# Patient Record
Sex: Female | Born: 1980 | Race: White | Hispanic: No | Marital: Single | Smoking: Former smoker
Health system: Southern US, Community
[De-identification: ages and names within clinical notes are randomized; demographics above are authoritative.]

## PROBLEM LIST (undated history)

## (undated) DIAGNOSIS — E282 Polycystic ovarian syndrome: Secondary | ICD-10-CM

## (undated) DIAGNOSIS — F32A Depression, unspecified: Secondary | ICD-10-CM

## (undated) HISTORY — DX: Depression, unspecified: F32.A

## (undated) HISTORY — PX: BACK SURGERY: SHX140

---

## 2013-03-06 HISTORY — PX: LAMINECTOMY: SHX219

## 2013-08-16 ENCOUNTER — Encounter (HOSPITAL_COMMUNITY): Payer: Self-pay | Admitting: Emergency Medicine

## 2013-08-16 ENCOUNTER — Emergency Department (HOSPITAL_COMMUNITY): Payer: BC Managed Care – PPO

## 2013-08-16 ENCOUNTER — Emergency Department (HOSPITAL_COMMUNITY)
Admission: EM | Admit: 2013-08-16 | Discharge: 2013-08-16 | Disposition: A | Discharge status: Home / Self Care | Payer: BC Managed Care – PPO | Attending: Emergency Medicine | Admitting: Emergency Medicine

## 2013-08-16 DIAGNOSIS — R109 Unspecified abdominal pain: Secondary | ICD-10-CM | POA: Diagnosis not present

## 2013-08-16 DIAGNOSIS — O9989 Other specified diseases and conditions complicating pregnancy, childbirth and the puerperium: Secondary | ICD-10-CM | POA: Diagnosis not present

## 2013-08-16 DIAGNOSIS — Z87891 Personal history of nicotine dependence: Secondary | ICD-10-CM | POA: Diagnosis not present

## 2013-08-16 DIAGNOSIS — O4690 Antepartum hemorrhage, unspecified, unspecified trimester: Secondary | ICD-10-CM

## 2013-08-16 DIAGNOSIS — Z880 Allergy status to penicillin: Secondary | ICD-10-CM | POA: Diagnosis not present

## 2013-08-16 DIAGNOSIS — O209 Hemorrhage in early pregnancy, unspecified: Secondary | ICD-10-CM | POA: Insufficient documentation

## 2013-08-16 DIAGNOSIS — Z862 Personal history of diseases of the blood and blood-forming organs and certain disorders involving the immune mechanism: Secondary | ICD-10-CM | POA: Diagnosis not present

## 2013-08-16 DIAGNOSIS — Z8639 Personal history of other endocrine, nutritional and metabolic disease: Secondary | ICD-10-CM | POA: Insufficient documentation

## 2013-08-16 HISTORY — DX: Polycystic ovarian syndrome: E28.2

## 2013-08-16 LAB — URINALYSIS, ROUTINE W REFLEX MICROSCOPIC
BILIRUBIN URINE: NEGATIVE
Glucose, UA: NEGATIVE mg/dL
Hgb urine dipstick: NEGATIVE
KETONES UR: NEGATIVE mg/dL
Leukocytes, UA: NEGATIVE
Nitrite: NEGATIVE
Protein, ur: NEGATIVE mg/dL
Specific Gravity, Urine: 1.022 (ref 1.005–1.030)
UROBILINOGEN UA: 1 mg/dL (ref 0.0–1.0)
pH: 6.5 (ref 5.0–8.0)

## 2013-08-16 LAB — BASIC METABOLIC PANEL
BUN: 14 mg/dL (ref 6–23)
CALCIUM: 9.1 mg/dL (ref 8.4–10.5)
CO2: 24 meq/L (ref 19–32)
Chloride: 103 mEq/L (ref 96–112)
Creatinine, Ser: 0.56 mg/dL (ref 0.50–1.10)
GFR calc Af Amer: >90 mL/min (ref >90)
GFR calc non Af Amer: >90 mL/min (ref >90)
GLUCOSE: 95 mg/dL (ref 70–99)
POTASSIUM: 3.9 meq/L (ref 3.7–5.3)
Sodium: 139 mEq/L (ref 137–147)

## 2013-08-16 LAB — CBC WITH DIFFERENTIAL/PLATELET
Basophils Absolute: 0.1 10*3/uL (ref 0.0–0.1)
Basophils Relative: 1 % (ref 0–1)
EOS PCT: 3 % (ref 0–5)
Eosinophils Absolute: 0.2 10*3/uL (ref 0.0–0.7)
HCT: 38.4 % (ref 36.0–46.0)
Hemoglobin: 13.1 g/dL (ref 12.0–15.0)
LYMPHS ABS: 2.4 10*3/uL (ref 0.7–4.0)
LYMPHS PCT: 34 % (ref 12–46)
MCH: 31.1 pg (ref 26.0–34.0)
MCHC: 34.1 g/dL (ref 30.0–36.0)
MCV: 91.2 fL (ref 78.0–100.0)
Monocytes Absolute: 0.8 10*3/uL (ref 0.1–1.0)
Monocytes Relative: 11 % (ref 3–12)
Neutro Abs: 3.7 10*3/uL (ref 1.7–7.7)
Neutrophils Relative %: 51 % (ref 43–77)
PLATELETS: 224 10*3/uL (ref 150–400)
RBC: 4.21 MIL/uL (ref 3.87–5.11)
RDW: 13.1 % (ref 11.5–15.5)
WBC: 7.3 10*3/uL (ref 4.0–10.5)

## 2013-08-16 LAB — HCG, QUANTITATIVE, PREGNANCY: hCG, Beta Chain, Quant, S: 1770 m[IU]/mL — ABNORMAL HIGH (ref <5)

## 2013-08-16 LAB — WET PREP, GENITAL
Trich, Wet Prep: NONE SEEN
Yeast Wet Prep HPF POC: NONE SEEN

## 2013-08-16 LAB — PREGNANCY, URINE: Preg Test, Ur: POSITIVE — AB

## 2013-08-16 MED ORDER — PRENATAL COMPLETE 14-0.4 MG PO TABS: 1.0000 | ORAL_TABLET | Freq: Every day | ORAL | Status: AC

## 2013-08-16 NOTE — ED Provider Notes (Signed)
CSN: [id number]     Arrival date & time 08/16/13  0741 History   First MD Initiated Contact with Patient 08/16/13 0754     Chief Complaint  Patient presents with  . Vaginal Bleeding  . Abdominal Cramping     (Consider location/radiation/quality/duration/timing/severity/associated sxs/prior Treatment) HPI Comments: Patient is a [id number] 33 year old female with a past medical history of PCOS who presents with abdominal pain and vaginal bleeding in pregnancy. Patient reports being about [redacted] weeks pregnant. Her abdominal pain started this morning. Symptoms started gradually and remained constant since the onset. The pain is cramping and moderate without radiation. Patient reports small amount of vaginal bleeding that she noticed when using the bathroom this morning. No aggravating/alleviating factors. No other associated symptoms. Patient reports having a previous abortion in 2003.  Patient is a 33 y.o. female presenting with vaginal bleeding and cramps.  Vaginal Bleeding Associated symptoms: abdominal pain   Associated symptoms: no dizziness, no dysuria, no fatigue, no fever and no nausea   Abdominal Cramping Associated symptoms: vaginal bleeding   Associated symptoms: no chest pain, no chills, no diarrhea, no dysuria, no fatigue, no fever, no nausea, no shortness of breath and no vomiting     Past Medical History  Diagnosis Date  . Polycystic disease, ovaries    Past Surgical History  Procedure Laterality Date  . Back surgery     No family history on file. History  Substance Use Topics  . Smoking status: Former Games developermoker  . Smokeless tobacco: Not on file  . Alcohol Use: No   OB History   Grav Para Term Preterm Abortions TAB SAB Ect Mult Living                 Review of Systems  Constitutional: Negative for fever, chills and fatigue.  HENT: Negative for trouble swallowing.   Eyes: Negative for visual disturbance.  Respiratory: Negative for shortness of breath.   Cardiovascular:  Negative for chest pain and palpitations.  Gastrointestinal: Positive for abdominal pain. Negative for nausea, vomiting and diarrhea.  Genitourinary: Positive for vaginal bleeding. Negative for dysuria and difficulty urinating.  Musculoskeletal: Negative for arthralgias and neck pain.  Skin: Negative for color change.  Neurological: Negative for dizziness and weakness.  Psychiatric/Behavioral: Negative for dysphoric mood.      Allergies  Penicillins  Home Medications   Prior to Admission medications   Not on File   BP 120/72  Pulse 94  Temp(Src) 98.4 F (36.9 C) (Oral)  Resp 18  SpO2 99%  LMP 07/08/2013 Physical Exam  Nursing note and vitals reviewed. Constitutional: She is oriented to person, place, and time. She appears well-developed and well-nourished. No distress.  HENT:  Head: Normocephalic and atraumatic.  Eyes: Conjunctivae and EOM are normal.  Neck: Normal range of motion.  Cardiovascular: Normal rate and regular rhythm.  Exam reveals no gallop and no friction rub.   No murmur heard. Pulmonary/Chest: Effort normal and breath sounds normal. She has no wheezes. She has no rales. She exhibits no tenderness.  Abdominal: Soft. She exhibits no distension. There is tenderness. There is no rebound and no guarding.  Mild lower abdominal tenderness to palpation. No focal tenderness to palpation or peritoneal signs.   Musculoskeletal: Normal range of motion.  Neurological: She is alert and oriented to person, place, and time. Coordination normal.  Speech is goal-oriented. Moves limbs without ataxia.   Skin: Skin is warm and dry.  Psychiatric: She has a normal mood and affect. Her  behavior is normal.    ED Course  Procedures (including critical care time) Labs Review Labs Reviewed  WET PREP, GENITAL - Abnormal; Notable for the following:    Clue Cells Wet Prep HPF POC FEW (*)    WBC, Wet Prep HPF POC FEW (*)    All other components within normal limits  PREGNANCY,  URINE - Abnormal; Notable for the following:    Preg Test, Ur POSITIVE (*)    All other components within normal limits  HCG, QUANTITATIVE, PREGNANCY - Abnormal; Notable for the following:    hCG, Beta Chain, Quant, S 1770 (*)    All other components within normal limits  GC/CHLAMYDIA PROBE AMP  CBC WITH DIFFERENTIAL  BASIC METABOLIC PANEL  URINALYSIS, ROUTINE W REFLEX MICROSCOPIC    Imaging Review [place] Ob Comp Less 14 Wks  08/16/2013   CLINICAL DATA:  Vaginal bleeding and abdominal cramping and pregnancy  EXAM: OBSTETRIC <14 WK US AND TRANSVAGINAL OB US  TECHNIQUE: Both transabdominal and transvaginal ultrasound examinations were performed for complete evaluation of the gestation as well as the maternal uterus, adnexal regions, and pelvic cul-de-sac. Transvaginal technique was performed to assess early pregnancy.  COMPARISON:  None.  FINDINGS: Intrauterine gestational sac: Visualized/normal in shape. No subchorionic hematoma.  Yolk sac:  Present and unremarkable  Embryo:  Not yet visualized.  MSD:  4.7  mm   5 w   2 d US EDC: 04/16/2014  Maternal uterus/adnexae: Probable corpus luteum on the right. Otherwise, symmetric and unremarkable appearing ovaries. No adnexal mass. No free pelvic fluid.  IMPRESSION: Single intrauterine gestation, estimated age 19 weeks 2 days. A follow-up US in 14 days could confirm viability.   Electronically Signed   By: Tiburcio PeaJonathan  Watts M.D.   On: 08/16/2013 11:21   [place] Ob Transvaginal  08/16/2013   CLINICAL DATA:  Vaginal bleeding and abdominal cramping and pregnancy  EXAM: OBSTETRIC <14 WK US AND TRANSVAGINAL OB US  TECHNIQUE: Both transabdominal and transvaginal ultrasound examinations were performed for complete evaluation of the gestation as well as the maternal uterus, adnexal regions, and pelvic cul-de-sac. Transvaginal technique was performed to assess early pregnancy.  COMPARISON:  None.  FINDINGS: Intrauterine gestational sac: Visualized/normal in shape. No  subchorionic hematoma.  Yolk sac:  Present and unremarkable  Embryo:  Not yet visualized.  MSD:  4.7  mm   5 w   2 d US EDC: 04/16/2014  Maternal uterus/adnexae: Probable corpus luteum on the right. Otherwise, symmetric and unremarkable appearing ovaries. No adnexal mass. No free pelvic fluid.  IMPRESSION: Single intrauterine gestation, estimated age 19 weeks 2 days. A follow-up US in 14 days could confirm viability.   Electronically Signed   By: Tiburcio PeaJonathan  Watts M.D.   On: 08/16/2013 11:21     EKG Interpretation None      MDM   Final diagnoses:  Vaginal bleeding in pregnancy    8:15 AM Labs and urinalysis pending. Vitals stable and patient afebrile. Patient will have pelvic exam followed by pelvic US.   12:28 PM Patient's labs show HCG of 1770. US shows IUP at 3755w2d. Patient will have recommended [facility]ck [facility]. Patient [facility] for further management of her pregnancy. Patient will be discharged with prenatal vitamin prescription. Patient advised to [facility] to [facility] with worsening or concerning symptoms.     Emilia BeckKaitlyn Ahnya Akre, PA-C 08/16/13 1229

## 2013-08-16 NOTE — ED Notes (Signed)
Pt from home reports that she is [redacted] weeks pregnant, noticed blood on tissue after using bathroom this am. Pt states that she had abortion in approx 4 years ago. Pt describes the bleeding as bright red spotting. Pt is nauseous, but has been since pregnancy started, no emesis. Pt states she is having mild cramps, but rates 0/10 pain. Pt is A&O and in NAD.

## 2013-08-16 NOTE — ED Notes (Signed)
Pt states that she woke up around 7am this morning abd found some bright pinkish vaginal bleeding and lower abd cramping.  Pt states that she is [redacted] weeks pregnant.  Pt states that there wasn't enough blood to fill up a menstrual pad.

## 2013-08-16 NOTE — ED Notes (Signed)
SW at bedside providing pt with Medicaid and [employer] information

## 2013-08-16 NOTE — ED Notes (Signed)
Provided pt with [place]urkey sandwich an 236mL Apple Juice

## 2013-08-16 NOTE — Discharge Instructions (Signed)
Take prenatal vitamins daily. [facility] in 2 days for a re-check of your HCG (pregnancy hormone in your blood). Your HCG level today is 1770. [facility] for further evaluation and management of your pregnancy. Go to the M[facility] Admissions Unit ([facility]) at [facility] with worsening or concerning symptoms.

## 2013-08-16 NOTE — Progress Notes (Signed)
CSW received consult from RN and PA to discuss medicaid and pregnancy with patient.  Pt reports that she came to ED for vaginal bleeding this AM that appeared to be more than spotting. Pt states that she is five weeks pregnant and has no insurance and does not know how she will pay for this ED visit or her future medical bills. Pt is teary and states that she is afraid that she has lost her baby. CSW provided supportive counseling.  After medical tests confirmed that pt was healthy and fetus was healthy, CSW provided financial information. CSW provided information on Medicaid, [place] [facility], and [facility].   Mariann LasterAlexandra Lydie Stammen LCSWA,     ED CSW  phone: [phone] 12:52pm

## 2013-08-17 NOTE — ED Provider Notes (Signed)
Medical screening examination/treatment/procedure(s) were performed by non-physician practitioner and as supervising physician I was immediately available for consultation/collaboration.   EKG Interpretation None       Aliou Mealey, MD 08/17/13 1006 

## 2013-08-18 LAB — GC/CHLAMYDIA PROBE AMP
CT PROBE, AMP APTIMA: NEGATIVE
GC Probe RNA: NEGATIVE

## 2013-08-19 ENCOUNTER — Telehealth: Payer: Self-pay | Admitting: *Deleted

## 2013-08-19 ENCOUNTER — Other Ambulatory Visit: Payer: BC Managed Care – PPO

## 2013-08-19 DIAGNOSIS — O039 Complete or unspecified spontaneous abortion without complication: Secondary | ICD-10-CM

## 2013-08-19 DIAGNOSIS — O200 Threatened abortion: Secondary | ICD-10-CM

## 2013-08-19 DIAGNOSIS — Z3480 Encounter for supervision of other normal pregnancy, unspecified trimester: Secondary | ICD-10-CM

## 2013-08-19 NOTE — Telephone Encounter (Signed)
Pt left message requesting her lab results from today. I called pt and left message on her personal voice mail that her test is still in process. She will be called with results once they are available.

## 2013-08-20 ENCOUNTER — Other Ambulatory Visit: Payer: Self-pay | Admitting: Advanced Practice Midwife

## 2013-08-20 DIAGNOSIS — O0281 Inappropriate change in quantitative human chorionic gonadotropin (hCG) in early pregnancy: Secondary | ICD-10-CM

## 2013-08-20 LAB — HCG, QUANTITATIVE, PREGNANCY: hCG, Beta Chain, Quant, S: 1710 m[IU]/mL

## 2013-08-20 NOTE — Telephone Encounter (Signed)
Called patient and she states that started bleeding heavier on Monday and is continuing to have heavier bleeding and knows this is a miscarriage. Lisa Leftwich- Kirby recommended ultrasound on Friday or stat bhcg. Asked patient if she would come in for an ultrasound on Friday and patient states that she doesn't have insurance right now so she would prefer not to do the ultrasound. Asked patient if she would come in for bloodwork on Friday morning and we can run it as stat and hopefully the result will come back before we close but we can't guarantee that. Patient was agreeable to coming in on Friday and states she knows it's a miscarriage. Patient asked what we would be looking for with the bloodwork. Told patient since this sounds like a miscarriage we want to make sure her levels are going down appropriately and if not there would be some concern for retained tissue. Patient verbalized understanding and had no further questions.

## 2013-08-22 ENCOUNTER — Other Ambulatory Visit: Payer: BC Managed Care – PPO

## 2013-08-22 DIAGNOSIS — O039 Complete or unspecified spontaneous abortion without complication: Secondary | ICD-10-CM

## 2013-08-23 LAB — HCG, QUANTITATIVE, PREGNANCY: hCG, Beta Chain, Quant, S: 199.1 m[IU]/mL

## 2013-08-25 ENCOUNTER — Telehealth: Payer: Self-pay | Admitting: *Deleted

## 2013-08-25 NOTE — Telephone Encounter (Signed)
Patient called to inquire about her recent results. I informed patient that the levels are dropping. I told her that I would check with a provider and see when she should return.  I had Dr. Reola CalkinsBeck review patients chart with me and she stated that patient should return in 2 weeks for repeat beta.

## 2013-08-26 NOTE — Telephone Encounter (Signed)
Called patient and left message to call us back for some information. She needs to come back in 2 weeks for repeat beta hcg.

## 2013-08-26 NOTE — Telephone Encounter (Signed)
Pt called the front desk and I informed pt of her beta results and that the provider wants her to come in two weeks to get a rpt beta quant to make sure that her levels drop to < 2.  Pt stated "ok, that she will be able to come in on July 2nd @ 0800 for the lab.  Pt then asked concerning that US called her and told her that she needed to come in for a US appt.  Pt stated that she canceled the US appt until she spoke with us.  Looking at pt's chart Sharen CounterLisa Leftwich-Kirby stated that pt can have either an US or stat beta to determine direction of miscarriage.  Pt had beta drawn on the 19 th instead of doing the US.  Pt stated understanding with no further questions.

## 2013-08-27 ENCOUNTER — Ambulatory Visit (HOSPITAL_COMMUNITY): Payer: BC Managed Care – PPO

## 2013-09-04 ENCOUNTER — Other Ambulatory Visit: Payer: BC Managed Care – PPO

## 2014-12-06 IMAGING — US US OB TRANSVAGINAL
1 series · 14 of 28 positions shown · non-contrast
Comparison: None.

CLINICAL DATA: Vaginal bleeding and abdominal cramping and
pregnancy

EXAM:
OBSTETRIC <14 WK US AND TRANSVAGINAL OB US
TECHNIQUE: Both transabdominal and transvaginal ultrasound examinations were
performed for complete evaluation of the gestation as well as the
maternal uterus, adnexal regions, and pelvic cul-de-sac.
Transvaginal technique was performed to assess early pregnancy.

[Series 1: us ob transvaginal · 0.22mm/px · 49 acquisitions, 14 frames shown]
[im 2/49]
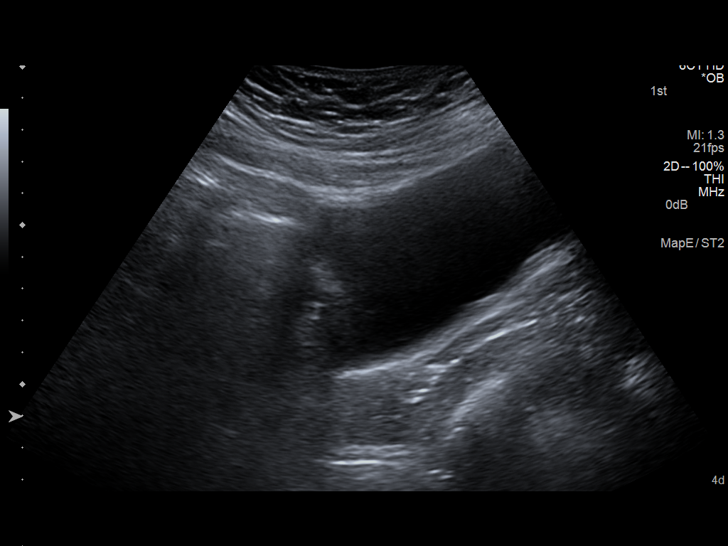
[im 6/49]
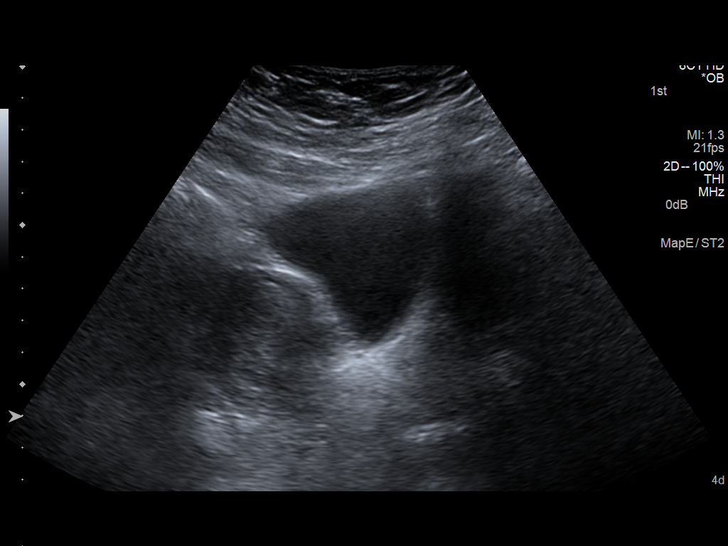
[im 9/49]
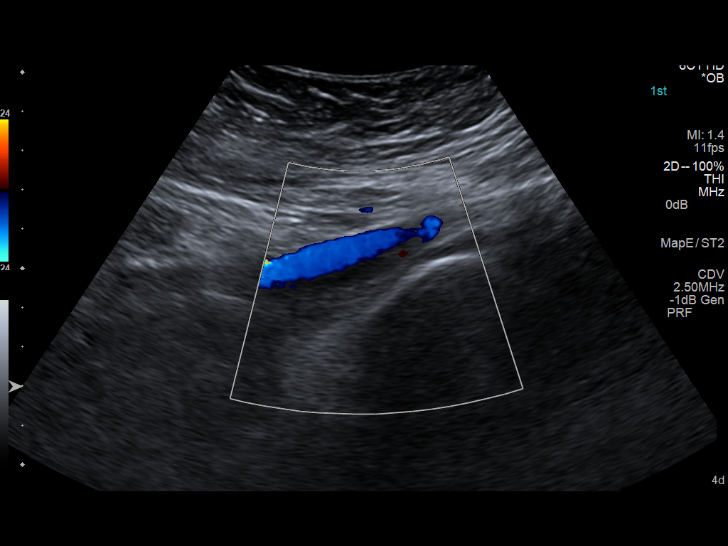
[im 13/49]
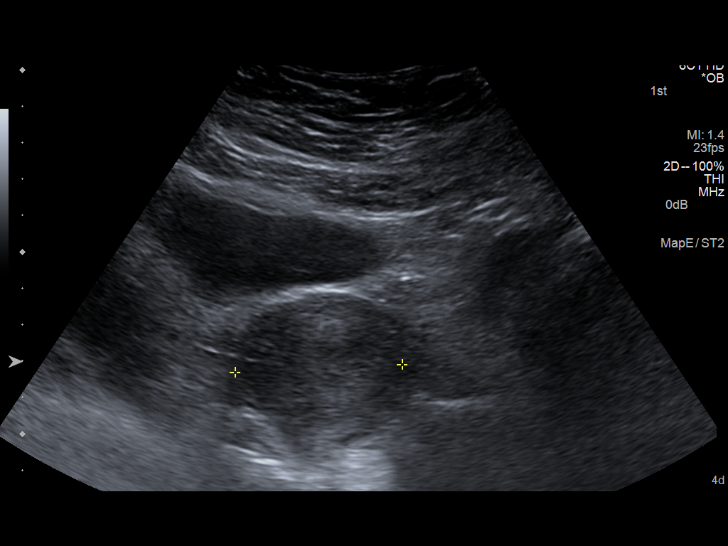
[im 17/49]
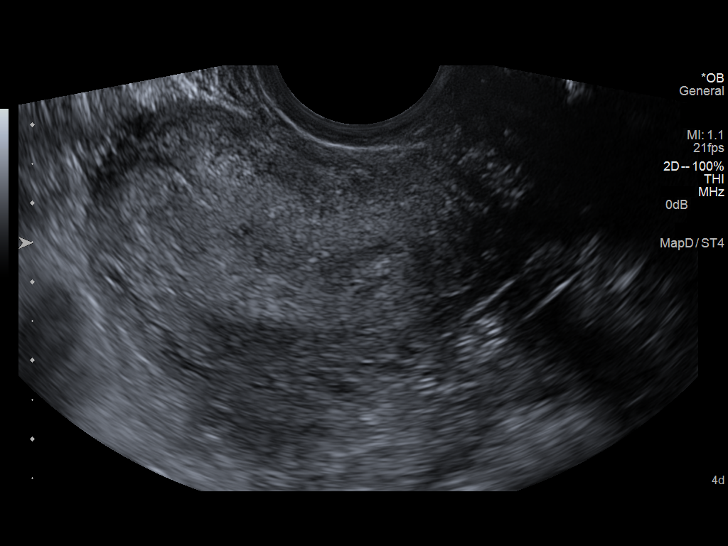
[im 20/49]
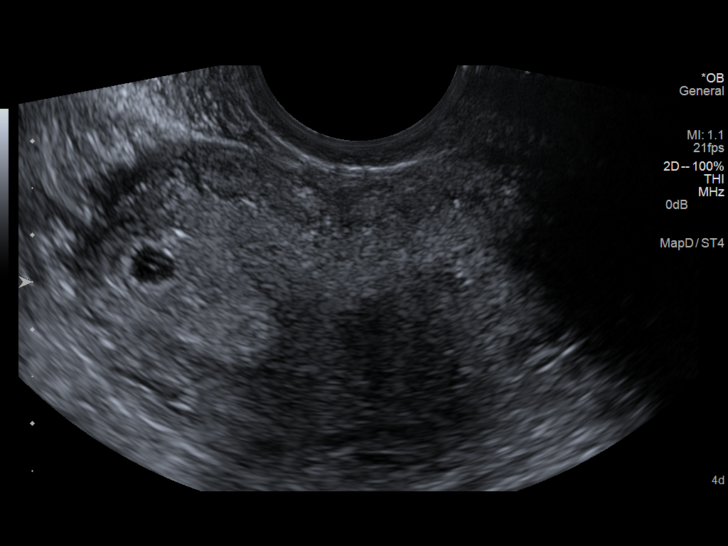
[im 24/49]
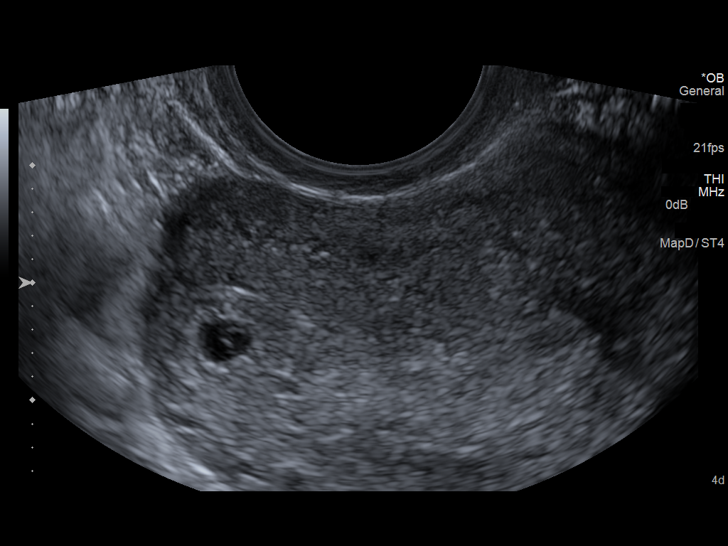
[im 27/49]
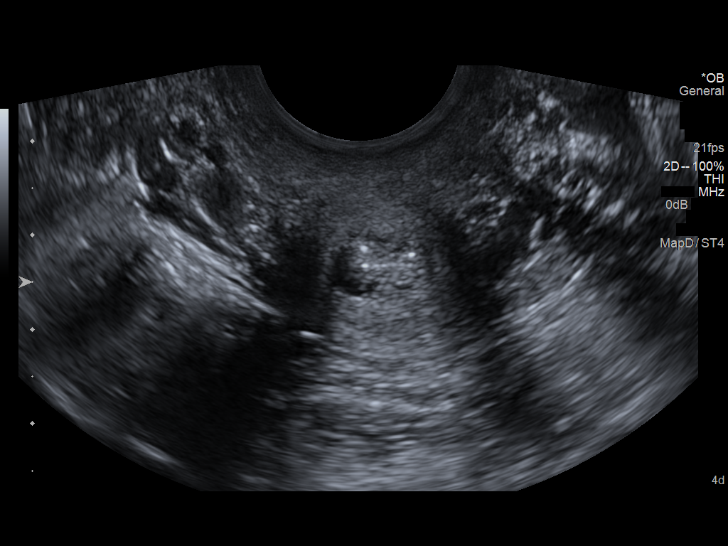
[im 31/49]
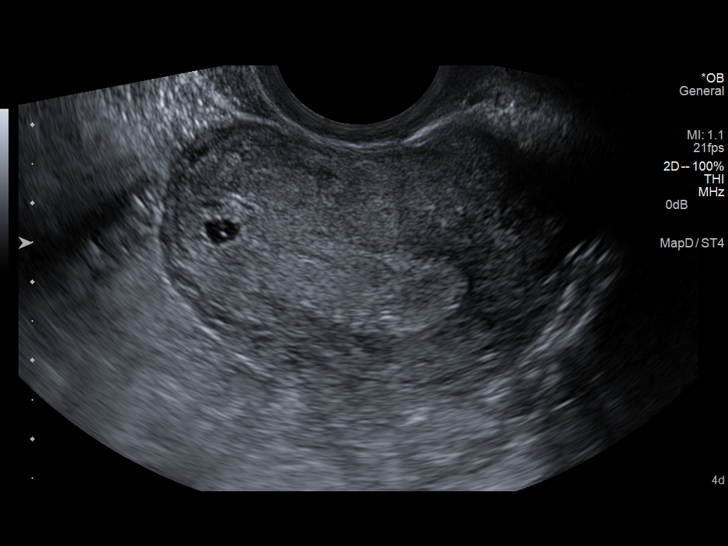
[im 34/49]
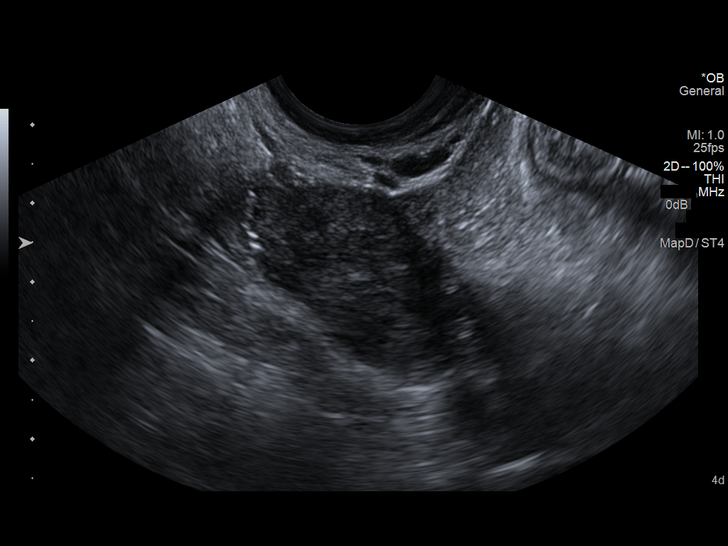
[im 38/49]
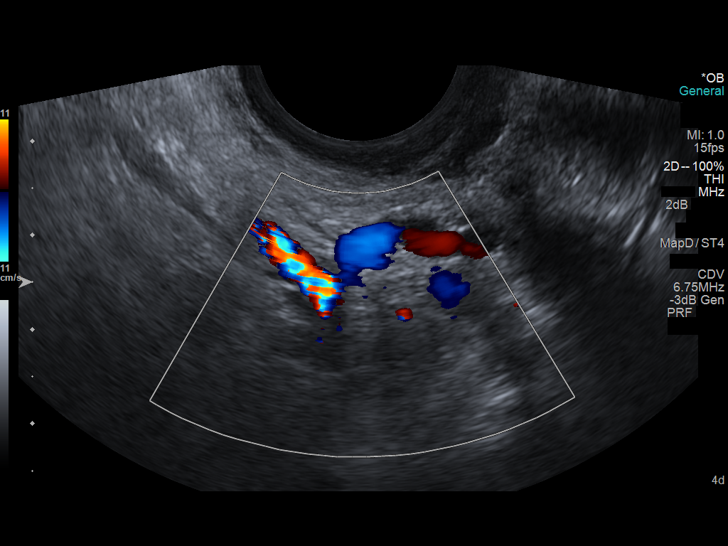
[im 41/49]
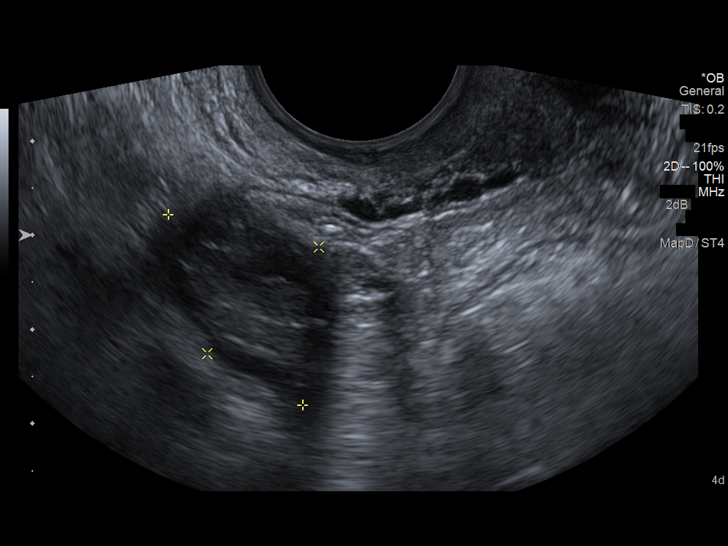
[im 45/49]
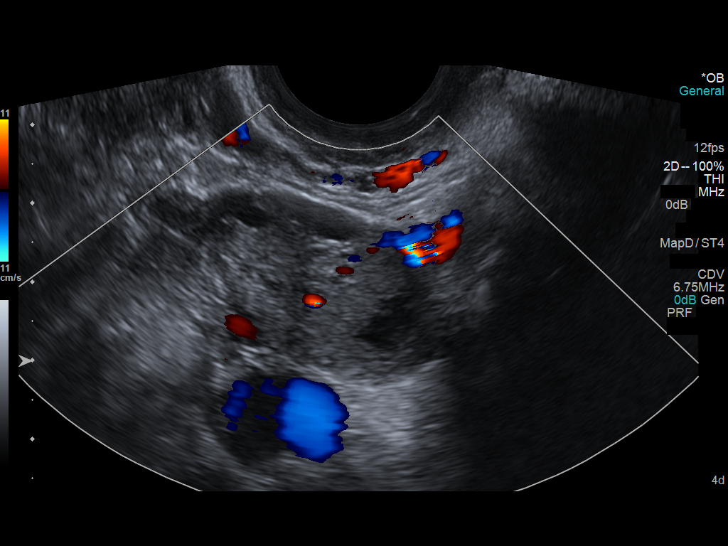
[im 49/49]
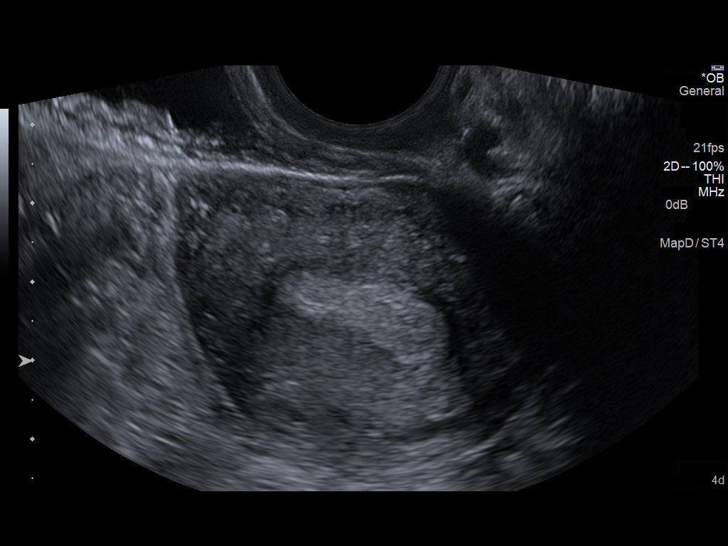

[14 of 28 positions shown; findings below may reference images not displayed]

FINDINGS: Intrauterine gestational sac: Visualized/normal in shape. No
subchorionic hematoma.

Yolk sac:  Present and unremarkable

Embryo:  Not yet visualized.

MSD:  4.7  mm   5 w   2 d US EDC: 04/16/2014

Maternal uterus/adnexae: Probable corpus luteum on the right.
Otherwise, symmetric and unremarkable appearing ovaries. No adnexal
mass. No free pelvic fluid.
IMPRESSION: Single intrauterine gestation, estimated age 5 weeks 2 days. A
follow-up US in 14 days could confirm viability.

## 2017-06-26 ENCOUNTER — Encounter: Payer: Self-pay | Admitting: Gastroenterology

## 2017-07-06 ENCOUNTER — Ambulatory Visit: Payer: Medicaid (Managed Care) | Attending: Obstetrics and Gynecology | Admitting: Obstetrics and Gynecology

## 2017-07-06 ENCOUNTER — Encounter: Payer: Self-pay | Admitting: Obstetrics and Gynecology

## 2017-07-06 VITALS — BP 128/84 | Ht 69.0 in | Wt 242.0 lb

## 2017-07-06 DIAGNOSIS — N3281 Overactive bladder: Secondary | ICD-10-CM

## 2017-07-06 DIAGNOSIS — N3941 Urge incontinence: Secondary | ICD-10-CM

## 2017-07-06 DIAGNOSIS — N393 Stress incontinence (female) (male): Secondary | ICD-10-CM

## 2017-07-06 NOTE — H&P (Signed)
History and Physical    HISTORY:  Chief Complaint   Patient presents with    New Patient Visit     incontience     History of Present Illness:    Kathleen Anthony is a 37 y.o. Female gravida 3 para 1 presents for initial consultation regarding urinary incontinence.  Her incontinence began 19 months ago after a vaginal delivery.  She states that the vaginal delivery was quite prolonged and traumatic.  Since the delivery she's had significant worsening of underlying urinary frequency/urgency but also developed urinary leakage associated with urgency, urinary leakage with coughing/sneezing/exercise, and is experienced urinary leakage during intercourse over the last 6 months.  She reports these symptoms to be very bothersome to her.  She reports leakage every day and describes it as being a lots.  She reports emptying her bladder well.  She has not tried any interventions or medical therapies for these symptoms.  She reports a history of frequent urinary tract infections since puberty.    She denies symptoms of vaginal bulge but does splint the perineum in order to have a bowel movement.    The patient is sexually active and reports recent onset of dyspareunia.    UDI-6 score of 100      Problems:  There is no problem list on file for this patient.     Past Medical/Surgical History:   Past Medical History:   Diagnosis Date    Depression      Past Surgical History:   Procedure Laterality Date    LAMINECTOMY  2015    pilonidal cyst removal  1999    tonsilectomy  2000    WISDOM TOOTH EXTRACTION  2014     Allergies:    Allergies   Allergen Reactions    Latex Hives and Rash     Current medications:    Current Outpatient Prescriptions   Medication Sig    lamoTRIgine (LAMICTAL) 150 MG tablet Take 150 mg by mouth daily    citalopram (CELEXA) 20 MG tablet Take 20 mg by mouth daily     Family History:    Family History   Problem Relation Age of Onset    Diabetes Maternal Grandmother     Heart attack Maternal Grandmother      Stroke Maternal Grandmother     Hypertension Maternal Grandmother     Diabetes Maternal Grandfather     Heart attack Maternal Grandfather     Stroke Maternal Grandfather     Hypertension Maternal Grandfather     Hypertension Mother      Social/Occupational History:   Social History     Social History    Marital status: Unknown     Spouse name: N/A    Number of children: N/A    Years of education: N/A     Social History Main Topics    Smoking status: Current Every Day Smoker     Packs/day: 0.50     Years: 15.00    Smokeless tobacco: Never Used    Alcohol use Yes      Comment: 0 - 1 drinks pe week    Drug use: Unknown    Sexual activity: Not Asked     Other Topics Concern    None     Social History Narrative    None     Review of Systems:    Review of Systems   Constitutional: Positive for malaise/fatigue.   Gastrointestinal: Positive for abdominal pain, blood in stool, constipation and  diarrhea.   Musculoskeletal: Positive for back pain, joint pain, myalgias and neck pain.   Skin: Positive for rash.   Neurological: Positive for tingling and headaches.   Psychiatric/Behavioral: Positive for depression and memory loss. The patient is nervous/anxious.    All other systems reviewed and are negative.    Vital Signs:   BP 128/84 (BP Location: Right arm, Patient Position: Sitting, Cuff Size: large adult)    Ht 1.753 m ( )    Wt 109.8 kg (242 lb)    LMP 07/03/2017 (Exact Date)    BMI 35.74 kg/m     PHYSICAL EXAM:  Physical Exam   Constitutional: She is oriented to person, place, and time. She appears well-developed and well-nourished. No distress.   HENT:   Head: Normocephalic and atraumatic.   Eyes: Conjunctivae are normal. Right eye exhibits no discharge. Left eye exhibits no discharge.   Neck: Neck supple. No tracheal deviation present.   Cardiovascular: Normal rate and intact distal pulses.    Pulmonary/Chest: Effort normal. No respiratory distress.   Abdominal: She exhibits no distension.    Genitourinary:   Genitourinary Comments: Vulva: Normal external genitalia.  Symmetric labia.  No skin lesions or pigmentation changes. Normal Bartholin's and Skene's glands.  Normal external urethral meatus.  No prolapse visible at the introitus.  Vagina: No epithelial lesions or pigmentation changes.  No tenderness with palpation underneath the bladder or urethra.  No release of urine or discharge with palpation underneath the urethra.  No tenderness with palpation over the pelvic floor muscles.     POP-Q Measurements:  Aa = -1  Ba = -1 C = -7  GH = 3  PB = 2  TVL = 10  Ap = -2 Bp = -2  D = -8      Uterus:   Adnexa: No masses.  Nontender.  Rectal Exam: No external hemorrhoidal tissue. No fissures.  No rectal mucosal prolapse visible.  No rectal masses.  Intact sphincter.  Good resting tone and contractility.  Neurologic Exam: Anal wink reflex present.  Motor and sensory exams grossly normal.    Pelvic floor Muscle Contraction: 2 of 5    Cough Stress Test: negative prevoid, supine  Urethral Mobility: minimal by visual assessment  Void: 200 mL  Postvoid Residual Volume: 0 ml by bladder scan    Urine dipstick shows negative for WBC, nitrites, glucose.  positive for RBC's (on menses).         Musculoskeletal: She exhibits no edema.   Neurological: She is alert and oriented to person, place, and time.   Skin: She is not diaphoretic.   Psychiatric: She has a normal mood and affect. Her behavior is normal. Judgment and thought content normal.   Vitals reviewed.    Assessment:    Kathleen Anthony was seen today for new patient visit.    Diagnoses and all orders for this visit:    Overactive bladder    Urge incontinence    SUI (stress urinary incontinence, female)    .      Plan:      The clinical findings were discussed with the patient.  The patient reports being most bothered by stress urinary incontinence and leakage of urine during intercourse.  She is not a candidate for surgical intervention at this time is we did not see  objective evidence of leakage.  However, we reviewed management strategies including timed voids and avoidance of bladder irritants in addition to normalize fluid intake along with use  of a Poisse Impressa over-the-counter device or an incontinence ring.  The patient would like to proceed with a pessary fitting of an incontinence ring for output management of stress urinary incontinence.    Reviewed the bladder care pathway with a behavioral modifications mentioned above and the addition of medical therapy with anticholinergics.  Risks and benefits were discussed for pharmacologic therapy.  At this point patient like to continue with behavioral modifications and add in pelvic floor physical therapy in an attempt to avoid potential side effects from these medications.  Bladder matters book was provided to the patient in addition to list of pelvic floor physical therapy specialist within the region.  I encouraged the patient to discuss her dyspareunia with pelvic floor physical therapist and strategies to manage urinary loss during intercourse.    Son no evidence of perineal descent on today's exam.  Reassurance was provided to the patient that perineal splinting occurs in a significant portion of women in the [place].  We reviewed the use of a "squatty potty" or a stool to elevate her feet during bowel movements.    All the patient's questions were answered to her satisfaction.    The patient was seen and evaluated with Dr. Ralph Leyden    Follow-up:  1.  Pessary fitting  2. 3 months for CMI    Larkin Ina, DO  Urogynecology Fellow, [facility]

## 2017-07-10 NOTE — Progress Notes (Deleted)
Pessary Fitting   Repeat cst, neg at first visit 5-3  Kathleen Anthony is a 37 y.o. female here for pessary fitting for correction of    Stress urinary incontinence - onset 19 mo ago post SVD  Since the delivery she's had significant worsening of underlying urinary frequency/urgency but also developed urinary leakage associated with urgency, urinary leakage with coughing/sneezing/exercise, and is experienced urinary leakage during intercourse over the last 6 months.  She reports these symptoms to be very bothersome to her.  She reports leakage every day and describes it as being a lot  She also reports hx of frequent UTIS since puberty    Vaginal Prolapse    Urinary retention,   If yes:  PVR        Hysterectomy total     Hysterectomy, supracervical     No hysterectomy          Prior  Experience with pessary: none    Prolapse assessment: asymptomatic   Aa = -1  Ba = -1 C = -7  GH = 3  PB = 2  TVL = 10  Ap = -2 Bp = -2  D = -8  CST:                    - Negative @ 200  cc's    Urethral mobility:   >30 degrees by  visual assessment    qtip assessment        PVR:***    EXAM:    LMP 07/03/2017 (Exact Date)     Physical Exam   Constitutional: She is oriented to person, place, and time. She appears well-developed and well-nourished. No distress.   Cardiovascular: Normal rate, regular rhythm and intact distal pulses.   Pulmonary/Chest: No respiratory distress. She has no wheezes. She has no rales. She exhibits no tenderness.   Abdominal: Soft. She exhibits no distension and no mass. There is no tenderness. There is no rebound and no guarding.   Neurological: She is alert and oriented to person, place, and time.   Skin: Skin is warm and dry. No rash noted. She is not diaphoretic. No erythema. No pallor.   Psychiatric: She has a normal mood and affect. Her behavior is normal. Judgment and thought content normal.     External genitalia:  Symmetrical labia: {yes no:315493::"Yes"}            Skin pigmentation  normal:  {yes no:315493::"Yes"}                                    Urethral meatus normal:     {yes no:315493::"Yes"}      SUI prior to fitting today:     Positive      Negative     Not tested      Pessaries tried:    Gelhorn shorthorn      Gelhorn longhorn    Incontinence Ring    Ring with Support    Incontinence Ring with Support    Dish with Support    Incontinence Dish    Incontinence Dish with Support    Other    Results:     Fit with good reduction of prolapse                       Fit with good reduction of stress incontinence                        Unable to fit with pessary     Pessary Chosen:         Size:      '[]'$  Patient does not wish to learn self care  '[]'$  Patient desires to learn self care at future visit  '[]'$  Patient was able to learn self care and successfully demonstrated insertion, removal and cleaning of pessary.    IMPRESSION:      PLAN:  1. Followup visit in ***    EXAM PORTION OF VISIT CHAPERONED BY:

## 2017-07-13 ENCOUNTER — Encounter: Payer: Medicaid (Managed Care) | Admitting: Gynecology

## 2017-08-24 ENCOUNTER — Ambulatory Visit: Payer: Medicaid (Managed Care) | Admitting: Obstetrics and Gynecology

## 2017-10-10 ENCOUNTER — Encounter: Payer: Medicaid (Managed Care) | Admitting: Obstetrics and Gynecology

## 5021-11-04 DEATH — deceased
